# Patient Record
Sex: Male | Born: 1960 | Race: White | Hispanic: No | Marital: Married | Smoking: Never smoker
Health system: Southern US, Community
[De-identification: ages and names within clinical notes are randomized; demographics above are authoritative.]

## PROBLEM LIST (undated history)

## (undated) DIAGNOSIS — R519 Headache, unspecified: Secondary | ICD-10-CM

## (undated) DIAGNOSIS — F419 Anxiety disorder, unspecified: Secondary | ICD-10-CM

## (undated) DIAGNOSIS — R945 Abnormal results of liver function studies: Secondary | ICD-10-CM

## (undated) DIAGNOSIS — R7989 Other specified abnormal findings of blood chemistry: Secondary | ICD-10-CM

## (undated) DIAGNOSIS — R51 Headache: Secondary | ICD-10-CM

## (undated) DIAGNOSIS — E785 Hyperlipidemia, unspecified: Secondary | ICD-10-CM

## (undated) HISTORY — PX: HERNIA REPAIR: SHX51

## (undated) HISTORY — PX: CHOLECYSTECTOMY: SHX55

## (undated) HISTORY — PX: NO PAST SURGERIES: SHX2092

## (undated) HISTORY — PX: NASAL SEPTUM SURGERY: SHX37

---

## 2014-07-03 ENCOUNTER — Ambulatory Visit: Payer: TRICARE For Life (TFL)

## 2014-07-03 ENCOUNTER — Ambulatory Visit
Admission: EM | Admit: 2014-07-03 | Discharge: 2014-07-03 | Disposition: A | Discharge status: Other Acute Inpatient Hospital | Payer: TRICARE For Life (TFL) | Attending: Family Medicine | Admitting: Family Medicine

## 2014-07-03 DIAGNOSIS — W278XXA Contact with other nonpowered hand tool, initial encounter: Secondary | ICD-10-CM | POA: Insufficient documentation

## 2014-07-03 DIAGNOSIS — S62609B Fracture of unspecified phalanx of unspecified finger, initial encounter for open fracture: Secondary | ICD-10-CM | POA: Diagnosis not present

## 2014-07-03 DIAGNOSIS — S61211A Laceration without foreign body of left index finger without damage to nail, initial encounter: Secondary | ICD-10-CM | POA: Diagnosis present

## 2014-07-03 DIAGNOSIS — S62661B Nondisplaced fracture of distal phalanx of left index finger, initial encounter for open fracture: Secondary | ICD-10-CM | POA: Insufficient documentation

## 2014-07-03 MED ORDER — TETANUS-DIPHTH-ACELL PERTUSSIS 5-2.5-18.5 LF-MCG/0.5 IM SUSP
0.5000 mL | Freq: Once | INTRAMUSCULAR | Status: AC
  Administered 2014-07-03: 0.5 mL via INTRAMUSCULAR

## 2014-07-03 NOTE — ED Notes (Signed)
Patient states that he was at home and hammered his left hand index finger. He states that tetanus shot was in 2010. He has a laceration and bleeding.

## 2014-07-03 NOTE — ED Provider Notes (Addendum)
CSN: [id number]     Arrival date & time 07/03/14  1036 History   First MD Initiated Contact with Patient 07/03/14 1115     Chief Complaint  Patient presents with  . Finger Injury    Happened at 1000am today   (Consider location/radiation/quality/duration/timing/severity/associated sxs/prior Treatment) The history is provided by the patient and the spouse.   this a 54 year old ambidextrous male who presents with an injury to his left index fingertip. This happened earlier this morning when he was setting fencepost and inadvertently struck his finger tip with a hammer. There is a laceration just proximal to the nailbed and extends around to the radial side. He is not current on his  tetanus toxoid.  Past Surgical History  Procedure Laterality Date  . No past surgeries     No family history on file. History  Substance Use Topics  . Smoking status: Never Smoker   . Smokeless tobacco: Not on file  . Alcohol Use: 0.0 oz/week    0 Standard drinks or equivalent per week     Comment: 1 case of beer weekly    Review of Systems  All other systems reviewed and are negative.   Allergies  Review of patient's allergies indicates no known allergies.  Home Medications   Prior to Admission medications   Not on File   BP 132/87 mmHg  Pulse 54  Temp(Src) 97.4 F (36.3 C) (Tympanic)  Resp 16  Ht 6' (1.829 m)  Wt 215 lb (97.523 kg)  BMI 29.15 kg/m2  SpO2 99% Physical Exam  Constitutional: He is oriented to person, place, and time. He appears well-developed and well-nourished.  HENT:  Head: Normocephalic and atraumatic.  Eyes: EOM are normal. Pupils are equal, round, and reactive to light.  Neck: Normal range of motion. Neck supple.  Musculoskeletal: Normal range of motion. He exhibits edema and tenderness.  Examination of the left index finger shows a transverse laceration just proximal to the nailbed extending around the radial dorsal surface. There is no subungual hematoma present.  FDS and FDP is intact to stressing. The laceration does not have much separation except on the radial most border. Sensation the tip is decreased to light touch. There is no obvious deformity of the tip.  Neurological: He is alert and oriented to person, place, and time. He has normal reflexes.  Skin: Skin is warm and dry.  Psychiatric: He has a normal mood and affect. His behavior is normal. Judgment normal.    ED Course  Procedures (including critical care time) Labs Review Labs Reviewed - No data to display  Imaging Review Dg Finger Index Left  07/03/2014   CLINICAL DATA:  Hit with hammer  EXAM: LEFT INDEX FINGER 2+V  COMPARISON:  None.  FINDINGS: Transverse fracture of the tuft of the distal first phalanx. 1 mm density in the soft tissues may represent a bone fragment or a small foreign body. It has similar density to bone. There is soft tissue injury and laceration.  IMPRESSION: Transverse fracture of the tuft of the distal first phalanx without significant displacement. Probable small bone fragment in the soft tissues.   Electronically Signed   By: Marlan Palauharles  Clark M.D.   On: 07/03/2014 12:20     MDM   1. Fracture, finger, open, initial encounter    I had a long discussion with the patient and his wife. They tell me that  They are planning a trip to [place] beginning on Saturday, 2 days away. Because of his  open fracture I recommended that he not leave without being evaluated by orthopedic because a high incidence of infections with open fractures. I attempted to call 2 separate physicians and after 2 hours did not call me back to discuss the case with him leaving in 2 days and being gone for 1 week. Because of this,[facility] be [facility]. A dry sterile dressing was applied to the finger and the left [facility] by private vehicle.   Lutricia FeilWilliam P Avelynn Sellin, PA-C 07/03/14 1404  11:26 Medication Given MH  Tdap (BOOSTRIX) injection 0.5 mL -  Dose: 0.5 mL ; Route: Intramuscular ; Site: Right Deltoid ; Scheduled Time: [address]       Lutricia FeilWilliam P Kaidon Kinker, PA-C 07/23/14 1503

## 2014-07-03 NOTE — Discharge Instructions (Signed)

## 2015-08-22 ENCOUNTER — Encounter: Payer: Self-pay | Admitting: Gynecology

## 2015-08-22 ENCOUNTER — Ambulatory Visit (INDEPENDENT_AMBULATORY_CARE_PROVIDER_SITE_OTHER): Payer: TRICARE For Life (TFL)

## 2015-08-22 ENCOUNTER — Ambulatory Visit
Admission: EM | Admit: 2015-08-22 | Discharge: 2015-08-22 | Disposition: A | Discharge status: Home / Self Care | Payer: TRICARE For Life (TFL) | Attending: Family Medicine | Admitting: Family Medicine

## 2015-08-22 DIAGNOSIS — W540XXA Bitten by dog, initial encounter: Secondary | ICD-10-CM

## 2015-08-22 DIAGNOSIS — S61551A Open bite of right wrist, initial encounter: Secondary | ICD-10-CM

## 2015-08-22 HISTORY — DX: Headache, unspecified: R51.9

## 2015-08-22 HISTORY — DX: Hyperlipidemia, unspecified: E78.5

## 2015-08-22 HISTORY — DX: Headache: R51

## 2015-08-22 HISTORY — DX: Hemochromatosis, unspecified: E83.119

## 2015-08-22 HISTORY — DX: Other specified abnormal findings of blood chemistry: R79.89

## 2015-08-22 HISTORY — DX: Anxiety disorder, unspecified: F41.9

## 2015-08-22 HISTORY — DX: Abnormal results of liver function studies: R94.5

## 2015-08-22 MED ORDER — OXYCODONE-ACETAMINOPHEN 5-325 MG PO TABS: 1.0000 | ORAL_TABLET | Freq: Three times a day (TID) | ORAL | Status: DC | PRN

## 2015-08-22 MED ORDER — CEFTRIAXONE SODIUM 1 G IJ SOLR
1.0000 g | Freq: Once | INTRAMUSCULAR | Status: AC
  Administered 2015-08-22: 1 g via INTRAMUSCULAR

## 2015-08-22 MED ORDER — MUPIROCIN 2 % EX OINT: TOPICAL_OINTMENT | CUTANEOUS | Status: AC

## 2015-08-22 MED ORDER — AMOXICILLIN-POT CLAVULANATE 875-125 MG PO TABS: 1.0000 | ORAL_TABLET | Freq: Two times a day (BID) | ORAL | Status: AC

## 2015-08-22 NOTE — ED Notes (Signed)
Patient c/o dog bite at right wrist x yesterday around noon. Per patient was bitten by the neighbor dog. Patient stated that the dog has been vaccinated per neighbor. Patient right wrist swollen / painful.

## 2015-08-22 NOTE — ED Provider Notes (Signed)
[facility]  ____________________________________________  Time seen: Approximately 0845 AM  I have reviewed the triage vital signs and the nursing notes.   HISTORY  Chief Complaint Animal Bite  HPI Benjamin Stout is a 55 y.o. male presents with complaint of right wrist dog bite. Patient reports that he sustained a dog bite to his right wrist yesterday afternoon. Patient states that he went over to his neighbor's house to help them work on something and states he reached over the fence to open the gate. Patient states when he reached over the fence, the dog jumped up and bit his right forearm and tried to pull some, but then released. Patient reports it was 1 bite. 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 Denies any other bite or injury. Denies fall to the ground, head injury or loss consciousness. Patient reports he is right-hand dominant.  Patient reports he and his family cleaned and irrigated the wounds immediately after, but states he is here today as his wife is concerned that it may get infected. Denies any drainage. Patient states mild swelling and mild redness. States mild pain with wrist rotation. Denies any other fall or trauma. Denies history of right wrist issues. Reports tetanus immunization is up-to-date, reports received 1 year ago.  Reports this is a neighbor's dog and per patient neighbors report that dog is up-to-date on rabies immunizations.   Past Medical History  Diagnosis Date  . Anxiety   . Hemochromatosis   . Head ache   . Dyslipidemia   . Elevated LFTs     There are no active problems to display for this patient.   Past Surgical History  Procedure Laterality Date  . No past surgeries    . Cholecystectomy    . Hernia repair    . Nasal septum surgery      Current Outpatient Rx  Name  Route  Sig  Dispense  Refill  . escitalopram (LEXAPRO) 10 MG tablet   Oral   Take 10 mg by mouth daily.         . Multiple Vitamin (MULTIVITAMIN)  capsule   Oral   Take 1 capsule by mouth daily.         .           .           .             Allergies Review of patient's allergies indicates no known allergies.  No family history on file.  Social History Social History  Substance Use Topics  . Smoking status: Never Smoker   . Smokeless tobacco: None  . Alcohol Use: 0.0 oz/week    0 Standard drinks or equivalent per week     Comment: 1 case of beer weekly    Review of Systems Constitutional: No fever/chills Eyes: No visual changes. ENT: No sore throat. Cardiovascular: Denies chest pain. Respiratory: Denies shortness of breath. Gastrointestinal: No abdominal pain.  No nausea, no vomiting.  No diarrhea.  No constipation. Genitourinary: Negative for dysuria. Musculoskeletal: Negative for back pain. Skin: Negative for rash.As above. Neurological: Negative for headaches, focal weakness or numbness.  10-point ROS otherwise negative.  ____________________________________________   PHYSICAL EXAM:  VITAL SIGNS: ED Triage Vitals  Enc Vitals Group     BP 08/22/15 0824 115/85 mmHg     Pulse Rate 08/22/15 0824 60     Resp 08/22/15 0824 16     Temp 08/22/15 0824 98.2 F (36.8 C)  Temp Source 08/22/15 0824 Oral     SpO2 08/22/15 0824 100 %     Weight 08/22/15 0824 210 lb (95.255 kg)     Height 08/22/15 0824 6' (1.829 m)     Head Cir --      Peak Flow --      Pain Score 08/22/15 0829 4     Pain Loc --      Pain Edu? --      Excl. in GC? --     Constitutional: Alert and oriented. Well appearing and in no acute distress. Eyes: Conjunctivae are normal. PERRL. EOMI. Head: Atraumatic.  Ears: Normal external appearance bilaterally.  Nose: No congestion/rhinnorhea.  Mouth/Throat: Mucous membranes are moist.   Neck: No stridor.  No cervical spine tenderness to palpation. Cardiovascular: Normal rate, regular rhythm. Grossly normal heart sounds.  Good peripheral circulation. Respiratory: Normal respiratory effort.   No retractions. Lungs CTAB. No wheezes, rales or rhonchi. Musculoskeletal: No lower or upper extremity tenderness nor edema.  No cervical, thoracic or lumbar tenderness to palpation. Except: Right distal wrist dorsal lateral aspect mild distal ulnar tenderness and mild distal wrist tenderness, mid distal wrist dorsally minimal to mild swelling and mild erythema, healing puncture wounds 3 located to distal dorsal mid wrist and 3 to distal volar aspect of mid wrist, mild pain with right wrist rotation but full range of motion present, bilateral hand grips strong and equal, bilateral distal radial pulses strong and equal, right upper extremity and right hand with full range of motion and without motor or tendon deficits, sensation intact. Neurologic:  Normal speech and language. No gross focal neurologic deficits are appreciated. No gait instability. Skin:  Skin is warm, dry and intact. No rash noted. Psychiatric: Mood and affect are normal. Speech and behavior are normal.  ____________________________________________   LABS (all labs ordered are listed, but only abnormal results are displayed)  Labs Reviewed - No data to display  RADIOLOGY  Dg Wrist Complete Right  08/22/2015  CLINICAL DATA:  Dog bite yesterday with swelling and pain, initial encounter EXAM: RIGHT WRIST - COMPLETE 3+ VIEW COMPARISON:  None. FINDINGS: No acute fracture or dislocation is noted. Mild soft tissue swelling is noted consistent with the recent injury. IMPRESSION: Soft tissue swelling without acute bony abnormality. Electronically Signed   By: Alcide CleverMark  Lukens M.D.   On: 08/22/2015 09:27   ____________________________________________   PROCEDURES  Procedure(s) performed:  _Dressing applied by RN and cock up Velcro wrist splint applied by RN. Neurovascular intact post application. ______________________   INITIAL IMPRESSION / ASSESSMENT AND PLAN / ED COURSE  Pertinent labs & imaging results that were available  during my care of the patient were reviewed by me and considered in my medical decision making (see chart for details).  Well-appearing patient. No acute distress. Presents for the complaints of right wrist injury post dog bite yesterday afternoon. Full range of motion. Neurovascular intact to right hand and right upper extremity. Healing wounds. Mild dorsal erythema around the wounds swelling with mild swelling. Will evaluate x-ray. Patient reports tetanus immunization is up-to-date. [facility].  Right wrist x-ray soft tissue swelling without acute bony abnormality per radiologist. 1 g Rocephin IM given once in urgent care. Will treat with oral Augmentin and topical Bactroban. Encouraged cleaning, ice, elevation and rest splint given. When necessary Percocet as needed for breakthrough pain. Encouraged 2-3 day wound check.Discussed indication, risks and benefits of medications with patient.  Discussed follow up with Primary  care physician this week. Discussed follow up and return parameters including no resolution or any worsening concerns. Patient verbalized understanding and agreed to plan.   ____________________________________________   FINAL CLINICAL IMPRESSION(S) / ED DIAGNOSES  Final diagnoses:  Dog bite of right wrist, initial encounter     Discharge Medication List as of 08/22/2015  9:19 AM    START taking these medications   Details  amoxicillin-clavulanate (AUGMENTIN) 875-125 MG tablet Take 1 tablet by mouth every 12 (twelve) hours., Starting 08/22/2015, Until Discontinued, Normal    mupirocin ointment (BACTROBAN) 2 % Apply three times a day for 5 days., Normal    oxyCODONE-acetaminophen (ROXICET) 5-325 MG tablet Take 1 tablet by mouth every 8 (eight) hours as needed for moderate pain or severe pain (Do not drive or operate heavy machinery while taking as can cause drowsiness.)., Starting 08/22/2015, Until Discontinued, Print        Note: This  dictation was prepared with Dragon dictation along with smaller phrase technology. Any transcriptional errors that result from this process are unintentional.       Renford DillsLindsey Grayden Burley, NP 08/22/15 1258

## 2015-08-22 NOTE — Discharge Instructions (Signed)
Take medication as prescribed. Apply ice and elevate. Clean daily with soap and water.    Wound check in 2 days.   Stay in contact with animal control.   Follow up with your primary care physician this week. Return to Urgent care for new or worsening concerns.    Animal Bite Animal bites can range from mild to serious. An animal bite can result in a scratch on the skin, a deep open cut, a puncture of the skin, a crush injury, or tearing away of the skin or a body part. A small bite from a house pet will usually not cause serious problems. However, some animal bites can become infected or injure a bone or other tissue.  Bites from certain animals can be more dangerous because of the risk of spreading rabies, which is a serious viral infection. This risk is higher with bites from stray animals or wild animals, such as raccoons, foxes, skunks, and bats. Dogs are responsible for most animal bites. Children are bitten more often than adults. SYMPTOMS  Common symptoms of an animal bite include:   Pain.   Bleeding.   Swelling.   Bruising.  DIAGNOSIS  This condition may be diagnosed based on a physical exam and medical history. Your health care provider will examine the wound and ask for details about the animal and how the bite happened. You may also have tests, such as:   Blood tests to check for infection or to determine if surgery is needed.  X-rays to check for damage to bones or joints.  Culture test. This uses a sample of fluid from the wound to check for infection. TREATMENT  Treatment varies depending on the location and type of animal bite and your medical history. Treatment may include:   Wound care. This often includes cleaning the wound, flushing the wound with saline solution, and applying a bandage (dressing). Sometimes, the wound is left open to heal because of the high risk of infection. However, in some cases, the wound may be closed with stitches (sutures), staples,  skin glue, or adhesive strips.   Antibiotic medicine.   Tetanus shot.   Rabies treatment if the animal could have rabies.  In some cases, bites that have become infected may require IV antibiotics and surgical treatment in the hospital.  HOME CARE INSTRUCTIONS Wound Care  Follow instructions from your health care provider about how to take care of your wound. Make sure you:  Wash your hands with soap and water before you change your dressing. If soap and water are not available, use hand sanitizer.  Change your dressing as told by your health care provider.  Leave sutures, skin glue, or adhesive strips in place. These skin closures may need to be in place for 2 weeks or longer. If adhesive strip edges start to loosen and curl up, you may trim the loose edges. Do not remove adhesive strips completely unless your health care provider tells you to do that.  Check your wound every day for signs of infection. Watch for:   Increasing redness, swelling, or pain.   Fluid, blood, or pus.  General Instructions  Take or apply over-the-counter and prescription medicines only as told by your health care provider.   If you were prescribed an antibiotic, take or apply it as told by your health care provider. Do not stop using the antibiotic even if your condition improves.   Keep the injured area raised (elevated) above the level of your heart while you  are sitting or lying down, if this is possible.   If directed, apply ice to the injured area.   Put ice in a plastic bag.   Place a towel between your skin and the bag.   Leave the ice on for 20 minutes, 2-3 times per day.   Keep all follow-up visits as told by your health care provider. This is important.  SEEK MEDICAL CARE IF:  You have increasing redness, swelling, or pain at the site of your wound.   You have a general feeling of sickness (malaise).   You feel nauseous or you vomit.   You have pain that  does not get better.  SEEK IMMEDIATE MEDICAL CARE IF:  You have a red streak extending away from your wound.   You have fluid, blood, or pus coming from your wound.   You have a fever or chills.   You have trouble moving your injured area.   You have numbness or tingling extending beyond the wound.   This information is not intended to replace advice given to you by your health care provider. Make sure you discuss any questions you have with your health care provider.   Document Released: 10/26/2010 Document Revised: 10/29/2014 Document Reviewed: 06/25/2014 Elsevier Interactive Patient Education 2016 [employer]  Wound Care Taking care of your wound properly can help to prevent pain and infection. It can also help your wound to heal more quickly.  HOW TO CARE FOR YOUR WOUND  Take or apply over-the-counter and prescription medicines only as told by your health care provider.  If you were prescribed antibiotic medicine, take or apply it as told by your health care provider. Do not stop using the antibiotic even if your condition improves.  Clean the wound each day or as told by your health care provider.  Wash the wound with mild soap and water.  Rinse the wound with water to remove all soap.  Pat the wound dry with a clean towel. Do not rub it.  There are many different ways to close and cover a wound. For example, a wound can be covered with stitches (sutures), skin glue, or adhesive strips. Follow instructions from your health care provider about:  How to take care of your wound.  When and how you should change your bandage (dressing).  When you should remove your dressing.  Removing whatever was used to close your wound.  Check your wound every day for signs of infection. Watch for:  Redness, swelling, or pain.  Fluid, blood, or pus.  Keep the dressing dry until your health care provider says it can be removed. Do not take baths, swim, use a hot tub, or do  anything that would put your wound underwater until your health care provider approves.  Raise (elevate) the injured area above the level of your heart while you are sitting or lying down.  Do not scratch or pick at the wound.  Keep all follow-up visits as told by your health care provider. This is important. SEEK MEDICAL CARE IF:  You received a tetanus shot and you have swelling, severe pain, redness, or bleeding at the injection site.  You have a fever.  Your pain is not controlled with medicine.  You have increased redness, swelling, or pain at the site of your wound.  You have fluid, blood, or pus coming from your wound.  You notice a bad smell coming from your wound or your dressing. SEEK IMMEDIATE MEDICAL CARE IF:  You  have a red streak going away from your wound.   This information is not intended to replace advice given to you by your health care provider. Make sure you discuss any questions you have with your health care provider.   Document Released: 11/17/2007 Document Revised: 06/24/2014 Document Reviewed: 02/03/2014 Elsevier Interactive Patient Education [employer]

## 2017-03-22 ENCOUNTER — Emergency Department

## 2017-03-22 ENCOUNTER — Emergency Department
Admission: EM | Admit: 2017-03-22 | Discharge: 2017-03-22 | Disposition: A | Discharge status: Home / Self Care | Attending: Emergency Medicine | Admitting: Emergency Medicine

## 2017-03-22 ENCOUNTER — Other Ambulatory Visit: Payer: Self-pay

## 2017-03-22 ENCOUNTER — Encounter: Payer: Self-pay | Admitting: *Deleted

## 2017-03-22 DIAGNOSIS — Y929 Unspecified place or not applicable: Secondary | ICD-10-CM | POA: Insufficient documentation

## 2017-03-22 DIAGNOSIS — W5512XA Struck by horse, initial encounter: Secondary | ICD-10-CM | POA: Insufficient documentation

## 2017-03-22 DIAGNOSIS — Y9389 Activity, other specified: Secondary | ICD-10-CM | POA: Diagnosis not present

## 2017-03-22 DIAGNOSIS — Z79899 Other long term (current) drug therapy: Secondary | ICD-10-CM | POA: Diagnosis not present

## 2017-03-22 DIAGNOSIS — Y998 Other external cause status: Secondary | ICD-10-CM | POA: Insufficient documentation

## 2017-03-22 DIAGNOSIS — S0990XA Unspecified injury of head, initial encounter: Secondary | ICD-10-CM | POA: Diagnosis not present

## 2017-03-22 DIAGNOSIS — S0993XA Unspecified injury of face, initial encounter: Secondary | ICD-10-CM

## 2017-03-22 MED ORDER — LIDOCAINE HCL 1 % IJ SOLN
5.0000 mL | Freq: Once | INTRAMUSCULAR | Status: DC
  Filled 2017-03-22: qty 5

## 2017-03-22 MED ORDER — LIDOCAINE HCL 1 % IJ SOLN
5.0000 mL | Freq: Once | INTRAMUSCULAR | Status: DC
Start: 2017-03-22 — End: 2017-03-23
  Filled 2017-03-22: qty 5

## 2017-03-22 MED ORDER — LIDOCAINE HCL (PF) 1 % IJ SOLN
INTRAMUSCULAR | Status: DC
Start: 2017-03-22 — End: 2017-03-23
  Filled 2017-03-22: qty 15

## 2017-03-22 MED ORDER — OXYCODONE-ACETAMINOPHEN 5-325 MG PO TABS
1.0000 | ORAL_TABLET | ORAL | Status: DC | PRN
  Administered 2017-03-22: 1 via ORAL
  Filled 2017-03-22: qty 1

## 2017-03-22 MED ORDER — OXYCODONE-ACETAMINOPHEN 5-325 MG PO TABS
1.0000 | ORAL_TABLET | Freq: Four times a day (QID) | ORAL | 0 refills | Status: AC | PRN
Start: 2017-03-22 — End: 2017-03-27

## 2017-03-22 MED ORDER — SULFAMETHOXAZOLE-TRIMETHOPRIM 800-160 MG PO TABS: 1.0000 | ORAL_TABLET | Freq: Two times a day (BID) | ORAL | 0 refills | Status: AC

## 2017-03-22 NOTE — ED Notes (Signed)
Patient transported to CT 

## 2017-03-22 NOTE — ED Triage Notes (Signed)
Pt to ED reporting he was kicked in the head by his horse. Pt denies having lost consciousness. No changes in vision reported. Swelling noted around left eye and two inch long with bleeding controlled. 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 RN dose not see stitch in place at this time. No neuro deficits noted. Pt denies pain or injury other than head.

## 2017-03-22 NOTE — ED Provider Notes (Signed)
[facility] Emergency Department Provider Note  ____________________________________________  Time seen: Approximately 10:05 PM  I have reviewed the triage vital signs and the nursing notes.   HISTORY  Chief Complaint Facial Trauma    HPI Benjamin Stout is a 57 y.o. male presenting to the emergency department after patient was kicked in the face by a horse.  Patient denies loss of consciousness.  He denies new blurry vision.  Patient sustained lacerations to the forehead, left cheek and left inferior eyelid.  Patient recently had a basal cell carcinoma removed by Dr. Bing Plume.  Patient denies nausea, vomiting abdominal pain.  He denies neck pain, weakness, radiculopathy or changes in sensation in the upper or lower extremities.  Patient has been speaking in complete sentences.  No alleviating measures have been attempted.  Past Medical History:  Diagnosis Date  . Anxiety   . Dyslipidemia   . Elevated LFTs   . Head ache   . Hemochromatosis     There are no active problems to display for this patient.   Past Surgical History:  Procedure Laterality Date  . CHOLECYSTECTOMY    . HERNIA REPAIR    . NASAL SEPTUM SURGERY    . NO PAST SURGERIES      Prior to Admission medications   Medication Sig Start Date End Date Taking? Authorizing Provider  amoxicillin-clavulanate (AUGMENTIN) 875-125 MG tablet Take 1 tablet by mouth every 12 (twelve) hours. 08/22/15   Renford Dills, NP  escitalopram (LEXAPRO) 10 MG tablet Take 10 mg by mouth daily.    [provider]  Multiple Vitamin (MULTIVITAMIN) capsule Take 1 capsule by mouth daily.    [provider]  mupirocin ointment (BACTROBAN) 2 % Apply three times a day for 5 days. 08/22/15   Renford Dills, NP  oxyCODONE-acetaminophen (PERCOCET/ROXICET) 5-325 MG tablet Take 1 tablet by mouth every 6 (six) hours as needed for up to 5 days for severe pain. 03/22/17 03/27/17  Orvil Feil, PA-C   sulfamethoxazole-trimethoprim (BACTRIM DS,SEPTRA DS) 800-160 MG tablet Take 1 tablet by mouth 2 (two) times daily for 7 days. 03/22/17 03/29/17  Orvil Feil, PA-C    Allergies Patient has no known allergies.  History reviewed. No pertinent family history.  Social History Social History   Tobacco Use  . Smoking status: Never Smoker  . Smokeless tobacco: Never Used  Substance Use Topics  . Alcohol use: Yes    Alcohol/week: 0.0 oz    Comment: 1 case of beer weekly  . Drug use: No     Review of Systems  Eyes: No visual changes. No discharge ENT: No upper respiratory complaints. Cardiovascular: no chest pain. Respiratory: no cough. No SOB. Gastrointestinal: No abdominal pain.  No nausea, no vomiting.  No diarrhea.  No constipation. Musculoskeletal: Negative for musculoskeletal pain. Skin: Patient has facial lacerations.  Neurological: Patient has headache, no focal weakness or numbness.   ____________________________________________   PHYSICAL EXAM:  VITAL SIGNS: ED Triage Vitals  Enc Vitals Group     BP 03/22/17 1803 (!) 148/101     Pulse Rate 03/22/17 1803 78     Resp 03/22/17 1803 20     Temp 03/22/17 1803 98.6 F (37 C)     Temp Source 03/22/17 1803 Oral     SpO2 03/22/17 1803 97 %     Weight 03/22/17 1800 210 lb (95.3 kg)     Height 03/22/17 1800 6\' 1"  (1.854 m)     Head Circumference --  Peak Flow --      Pain Score 03/22/17 1800 5     Pain Loc --      Pain Edu? --      Excl. in GC? --      Constitutional: Alert and oriented. Well appearing and in no acute distress. Eyes: Conjunctivae are normal. PERRL. EOMI. Head: Atraumatic. ENT:      Ears: TMs are pearly bilaterally.      Nose: No congestion/rhinnorhea.      Mouth/Throat: Mucous membranes are moist.  Neck: No stridor.  No cervical spine tenderness to palpation.  Cardiovascular: Normal rate, regular rhythm. Normal S1 and S2.  Good peripheral circulation. Respiratory: Normal respiratory  effort without tachypnea or retractions. Lungs CTAB. Good air entry to the bases with no decreased or absent breath sounds. Musculoskeletal: Full range of motion to all extremities. No gross deformities appreciated. Neurologic:  Normal speech and language. No gross focal neurologic deficits are appreciated.  Skin: Patient has a 1-1/2 cm laceration at the lateral aspect of the left inferior eyelid with muscle exposure.  Patient has a 8 cm laceration on cheek with muscle exposure.  Patient has a 4 cm laceration along the left forehead. Psychiatric: Mood and affect are normal. Speech and behavior are normal. Patient exhibits appropriate insight and judgement.   ____________________________________________   LABS (all labs ordered are listed, but only abnormal results are displayed)  Labs Reviewed - No data to display ____________________________________________  EKG   ____________________________________________  RADIOLOGY Geraldo Pitter, personally viewed and evaluated these images (plain radiographs) as part of my medical decision making, as well as reviewing the written report by the radiologist.  Ct Head Wo Contrast  Result Date: 03/22/2017 CLINICAL DATA:  Kicked in head by horse with headaches and facial swelling EXAM: CT HEAD WITHOUT CONTRAST CT MAXILLOFACIAL WITHOUT CONTRAST CT CERVICAL SPINE WITHOUT CONTRAST TECHNIQUE: Multidetector CT imaging of the head, cervical spine, and maxillofacial structures were performed using the standard protocol without intravenous contrast. Multiplanar CT image reconstructions of the cervical spine and maxillofacial structures were also generated. COMPARISON:  None. FINDINGS: CT HEAD FINDINGS Brain: No evidence of acute infarction, hemorrhage, hydrocephalus, extra-axial collection or mass lesion/mass effect. Vascular: No hyperdense vessel or unexpected calcification. Skull: Normal. Negative for fracture or focal lesion. Other: None. CT MAXILLOFACIAL  FINDINGS Osseous: No fracture or mandibular dislocation. No destructive process. Orbits: The orbits and their contents are within normal limits. Some periorbital swelling is noted on the left consistent with the recent injury. Small amount of air is noted within the soft tissues anterior to the left orbit consistent with the recent surgical history. Sinuses: Paranasal sinuses are within normal limits. Soft tissues: Soft tissue swelling is noted about the left orbit as described above. No other focal abnormality is seen. CT CERVICAL SPINE FINDINGS Alignment: Within normal limits. Skull base and vertebrae: 7 cervical segments are well visualized. Vertebral body height is well maintained. Osteophytic changes are noted from C3-C7. No significant anterolisthesis is noted. Mild facet hypertrophic changes are noted. No acute fracture or acute facet abnormality is noted. Posterior fusion defect at C1 is noted. Soft tissues and spinal canal: No acute soft tissue abnormality is noted Upper chest: Negative. Visualized upper chest is within normal limits. Other: None IMPRESSION: CT of the head: No acute intracranial abnormality is noted. CT of the maxillofacial bones: Soft tissue swelling about the left orbit is noted consistent with the recent injury and prior surgery. No acute bony abnormality is noted. CT  of cervical spine: Multilevel degenerative changes without acute abnormality. Electronically Signed   By: Alcide Clever M.D.   On: 03/22/2017 19:04   Ct Cervical Spine Wo Contrast  Result Date: 03/22/2017 CLINICAL DATA:  Kicked in head by horse with headaches and facial swelling EXAM: CT HEAD WITHOUT CONTRAST CT MAXILLOFACIAL WITHOUT CONTRAST CT CERVICAL SPINE WITHOUT CONTRAST TECHNIQUE: Multidetector CT imaging of the head, cervical spine, and maxillofacial structures were performed using the standard protocol without intravenous contrast. Multiplanar CT image reconstructions of the cervical spine and maxillofacial  structures were also generated. COMPARISON:  None. FINDINGS: CT HEAD FINDINGS Brain: No evidence of acute infarction, hemorrhage, hydrocephalus, extra-axial collection or mass lesion/mass effect. Vascular: No hyperdense vessel or unexpected calcification. Skull: Normal. Negative for fracture or focal lesion. Other: None. CT MAXILLOFACIAL FINDINGS Osseous: No fracture or mandibular dislocation. No destructive process. Orbits: The orbits and their contents are within normal limits. Some periorbital swelling is noted on the left consistent with the recent injury. Small amount of air is noted within the soft tissues anterior to the left orbit consistent with the recent surgical history. Sinuses: Paranasal sinuses are within normal limits. Soft tissues: Soft tissue swelling is noted about the left orbit as described above. No other focal abnormality is seen. CT CERVICAL SPINE FINDINGS Alignment: Within normal limits. Skull base and vertebrae: 7 cervical segments are well visualized. Vertebral body height is well maintained. Osteophytic changes are noted from C3-C7. No significant anterolisthesis is noted. Mild facet hypertrophic changes are noted. No acute fracture or acute facet abnormality is noted. Posterior fusion defect at C1 is noted. Soft tissues and spinal canal: No acute soft tissue abnormality is noted Upper chest: Negative. Visualized upper chest is within normal limits. Other: None IMPRESSION: CT of the head: No acute intracranial abnormality is noted. CT of the maxillofacial bones: Soft tissue swelling about the left orbit is noted consistent with the recent injury and prior surgery. No acute bony abnormality is noted. CT of cervical spine: Multilevel degenerative changes without acute abnormality. Electronically Signed   By: Alcide Clever M.D.   On: 03/22/2017 19:04   Ct Maxillofacial Wo Contrast  Result Date: 03/22/2017 CLINICAL DATA:  Kicked in head by horse with headaches and facial swelling EXAM: CT  HEAD WITHOUT CONTRAST CT MAXILLOFACIAL WITHOUT CONTRAST CT CERVICAL SPINE WITHOUT CONTRAST TECHNIQUE: Multidetector CT imaging of the head, cervical spine, and maxillofacial structures were performed using the standard protocol without intravenous contrast. Multiplanar CT image reconstructions of the cervical spine and maxillofacial structures were also generated. COMPARISON:  None. FINDINGS: CT HEAD FINDINGS Brain: No evidence of acute infarction, hemorrhage, hydrocephalus, extra-axial collection or mass lesion/mass effect. Vascular: No hyperdense vessel or unexpected calcification. Skull: Normal. Negative for fracture or focal lesion. Other: None. CT MAXILLOFACIAL FINDINGS Osseous: No fracture or mandibular dislocation. No destructive process. Orbits: The orbits and their contents are within normal limits. Some periorbital swelling is noted on the left consistent with the recent injury. Small amount of air is noted within the soft tissues anterior to the left orbit consistent with the recent surgical history. Sinuses: Paranasal sinuses are within normal limits. Soft tissues: Soft tissue swelling is noted about the left orbit as described above. No other focal abnormality is seen. CT CERVICAL SPINE FINDINGS Alignment: Within normal limits. Skull base and vertebrae: 7 cervical segments are well visualized. Vertebral body height is well maintained. Osteophytic changes are noted from C3-C7. No significant anterolisthesis is noted. Mild facet hypertrophic changes are noted. No acute fracture  or acute facet abnormality is noted. Posterior fusion defect at C1 is noted. Soft tissues and spinal canal: No acute soft tissue abnormality is noted Upper chest: Negative. Visualized upper chest is within normal limits. Other: None IMPRESSION: CT of the head: No acute intracranial abnormality is noted. CT of the maxillofacial bones: Soft tissue swelling about the left orbit is noted consistent with the recent injury and prior  surgery. No acute bony abnormality is noted. CT of cervical spine: Multilevel degenerative changes without acute abnormality. Electronically Signed   By: Alcide Clever M.D.   On: 03/22/2017 19:04    ____________________________________________    PROCEDURES  Procedure(s) performed:    Procedures  LACERATION REPAIR Performed by: Orvil Feil Authorized by: Orvil Feil Consent: Verbal consent obtained. Risks and benefits: risks, benefits and alternatives were discussed Consent given by: patient Patient identity confirmed: provided demographic data Prepped and Draped in normal sterile fashion Wound explored  Laceration Location: Forehead  Laceration Length: 4 cm  No Foreign Bodies seen or palpated  Anesthesia: local infiltration  Local anesthetic: lidocaine 1% without epinephrine  Anesthetic total: 3 ml  Irrigation method: syringe Amount of cleaning: standard  Skin closure: 5-0 Ethilon   Number of sutures: 6  Technique: Simple Interrupted  Patient tolerance: Patient tolerated the procedure well with no immediate complications.  LACERATION REPAIR Performed by: Orvil Feil Authorized by: Orvil Feil Consent: Verbal consent obtained. Risks and benefits: risks, benefits and alternatives were discussed Consent given by: patient Patient identity confirmed: provided demographic data Prepped and Draped in normal sterile fashion Wound explored  Laceration Location: Left Cheek  Laceration Length: 8 cm  No Foreign Bodies seen or palpated  Anesthesia: local infiltration  Local anesthetic: lidocaine 1% without epinephrine  Anesthetic total: 5 ml  Irrigation method: syringe Amount of cleaning: standard  Skin closure: 5-0 Ethilon   Number of sutures:  Superficial: 6 Simple Interrupted Deep: 3 Subcutaneous Interrupted   Patient tolerance: Patient tolerated the procedure well with no immediate complications.  LACERATION REPAIR Performed by:  Orvil Feil Authorized by: Orvil Feil Consent: Verbal consent obtained. Risks and benefits: risks, benefits and alternatives were discussed Consent given by: patient Patient identity confirmed: provided demographic data Prepped and Draped in normal sterile fashion Wound explored  Laceration Location: Left inferior eyelid  Laceration Length: 1.5 cm  No Foreign Bodies seen or palpated  Anesthesia: local infiltration  Local anesthetic: lidocaine 1% without epinephrine  Anesthetic total: 2 ml  Irrigation method: syringe Amount of cleaning: standard  Skin closure: 6-0 Ethilon   Number of sutures:  Superficial: 4 Simple Interrupted  Patient tolerance: Patient tolerated the procedure well with no immediate complications.    Medications  oxyCODONE-acetaminophen (PERCOCET/ROXICET) 5-325 MG per tablet 1 tablet (1 tablet Oral Given 03/22/17 1808)  lidocaine (XYLOCAINE) 1 % (with pres) injection 5 mL (not administered)  lidocaine (XYLOCAINE) 1 % (with pres) injection 5 mL (not administered)  lidocaine (XYLOCAINE) 1 % (with pres) injection 5 mL (not administered)     ____________________________________________   INITIAL IMPRESSION / ASSESSMENT AND PLAN / ED COURSE  Pertinent labs & imaging results that were available during my care of the patient were reviewed by me and considered in my medical decision making (see chart for details).  Review of the [place] CSRS was performed in accordance of the [facility] prior to dispensing any controlled drugs.     Assessment and Plan: Facial contusion Facial lacerations Patient presents to the emergency department after being  kicked by a horse.  Differential diagnosis included blowout fracture, cervical spine fracture, subdural hematoma, laceration and globe trauma.  CT head, CT maxillofacial and CT cervical spine were reassuring.  Patient underwent laceration repair in the emergency department.  Strict follow-up precautions were given to  make an appointment to see Dr. Chestine Sporelark to address inferior left eyelid laceration repair given recent surgery.  Other sutures were advised to be removed in 5 days.  Patient was discharged with Bactrim and Roxicet.  Vital signs are reassuring prior to discharge.  All patient questions were answered.    ____________________________________________  FINAL CLINICAL IMPRESSION(S) / ED DIAGNOSES  Final diagnoses:  Facial injury, initial encounter      NEW MEDICATIONS STARTED DURING THIS VISIT:  ED Discharge Orders        Ordered    oxyCODONE-acetaminophen (PERCOCET/ROXICET) 5-325 MG tablet  Every 6 hours PRN     03/22/17 2157    sulfamethoxazole-trimethoprim (BACTRIM DS,SEPTRA DS) 800-160 MG tablet  2 times daily     03/22/17 2157          This chart was dictated using voice recognition software/Dragon. Despite best efforts to proofread, errors can occur which can change the meaning. Any change was purely unintentional.    Orvil FeilWoods, Jettie Mannor M, PA-C 03/22/17 2221    Myrna BlazerSchaevitz, David Matthew, MD 03/24/17 [phone]

## 2018-08-13 IMAGING — CT CT MAXILLOFACIAL W/O CM
3 of 12 series · 9 of 47 positions shown, 11 images · non-contrast
Comparison: None.

CLINICAL DATA: Kicked in head by horse with headaches and facial
swelling

EXAM:
CT HEAD WITHOUT CONTRAST
CT MAXILLOFACIAL WITHOUT CONTRAST
CT CERVICAL SPINE WITHOUT CONTRAST
TECHNIQUE: Multidetector CT imaging of the head, cervical spine, and
maxillofacial structures were performed using the standard protocol
without intravenous contrast. Multiplanar CT image reconstructions
of the cervical spine and maxillofacial structures were also
generated.

[Series 8: sagittal bone · sagittal · 0.33mm/px · 1 of 238 slices shown]
[im 119/238  bone]
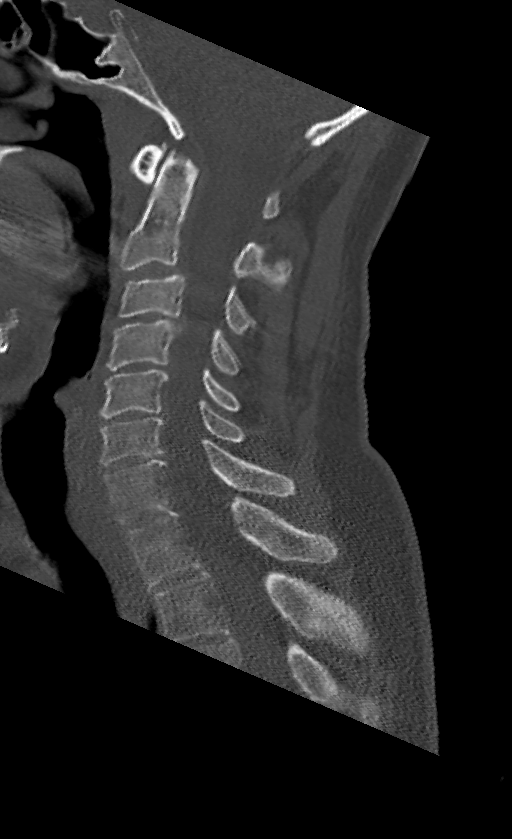

[Series 10: orthogonal axials · axial · 0.31mm/px · z∈[-285,-116]mm · 7 of 115 slices shown, 9 images]
[im 12/115  brain]
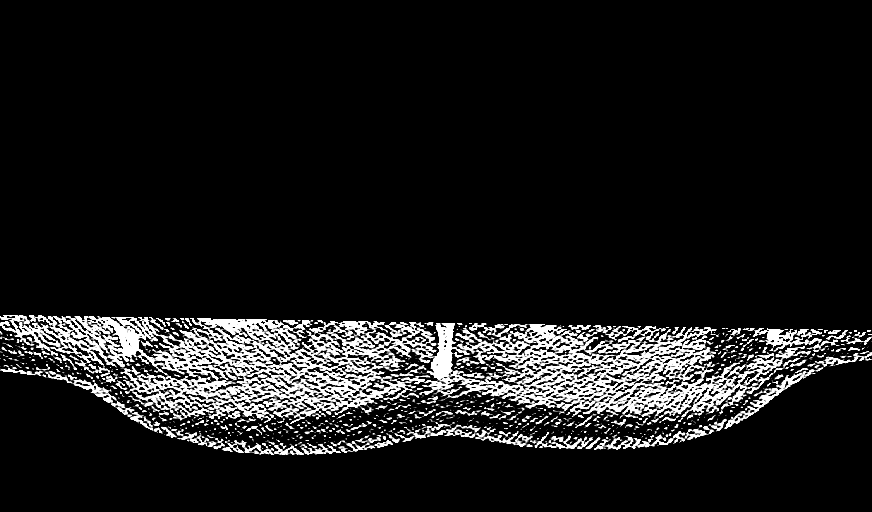
[im 12/115  bone]
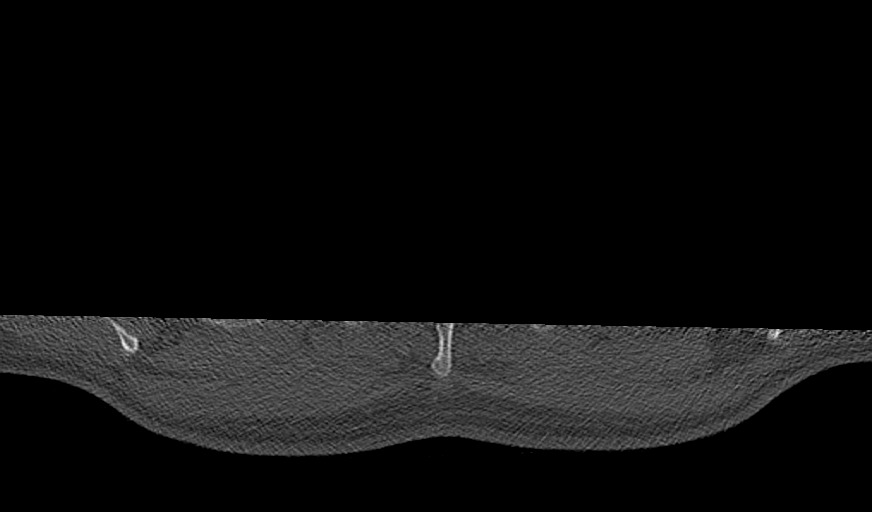
[im 23/115  bone]
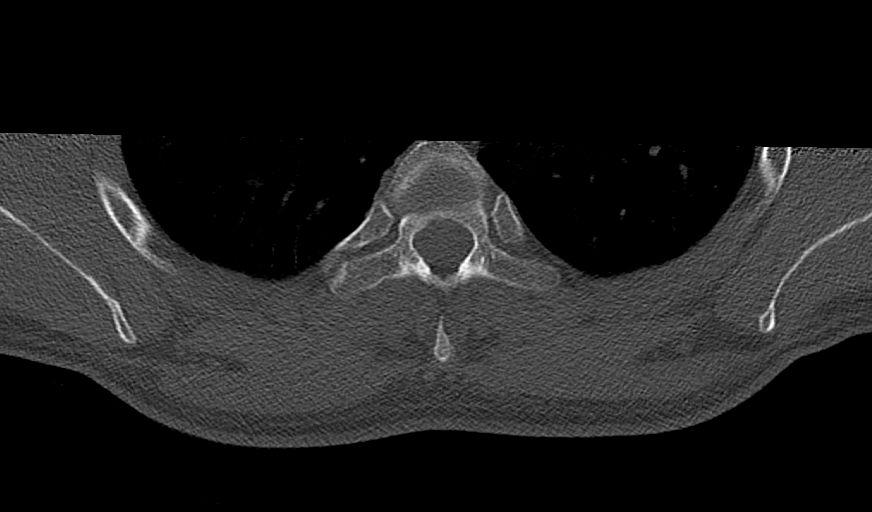
[im 46/115  bone]
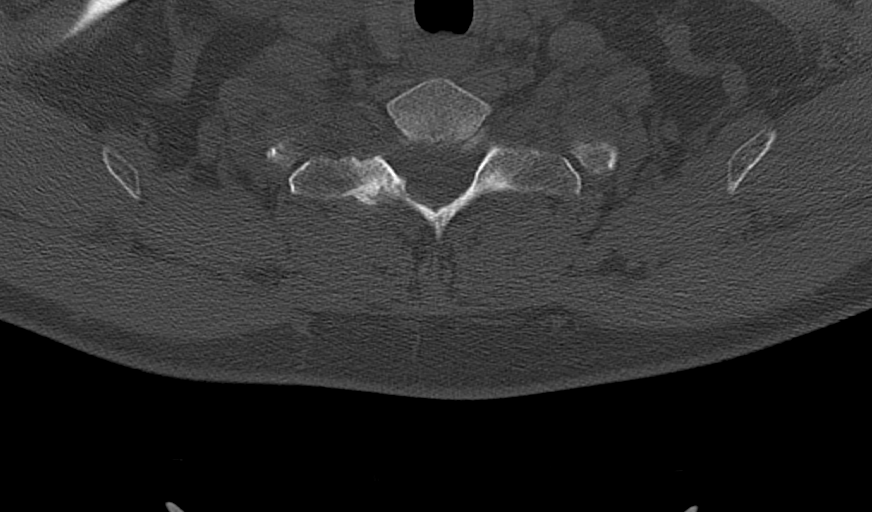
[im 58/115  bone]
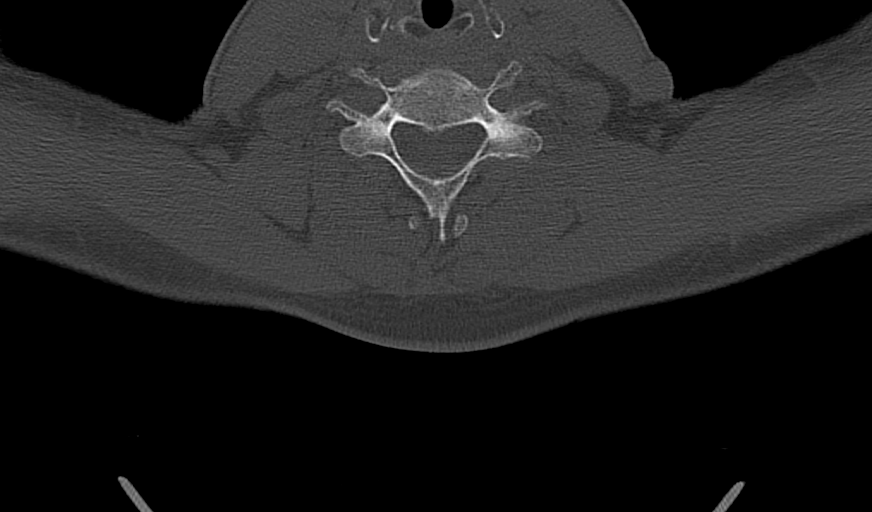
[im 69/115  brain]
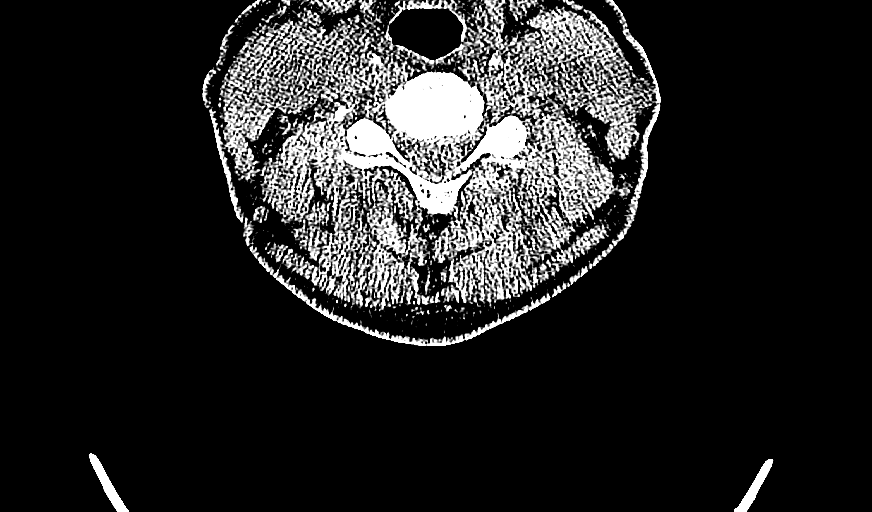
[im 69/115  bone]
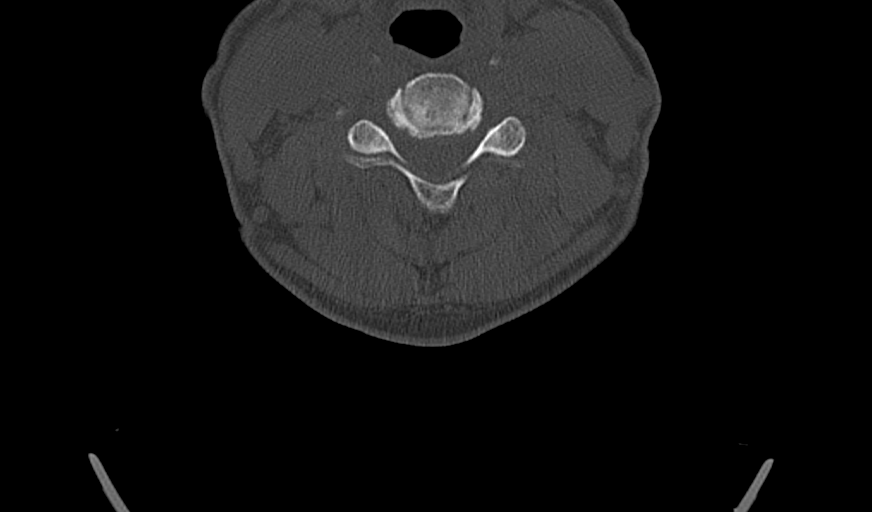
[im 92/115  bone]
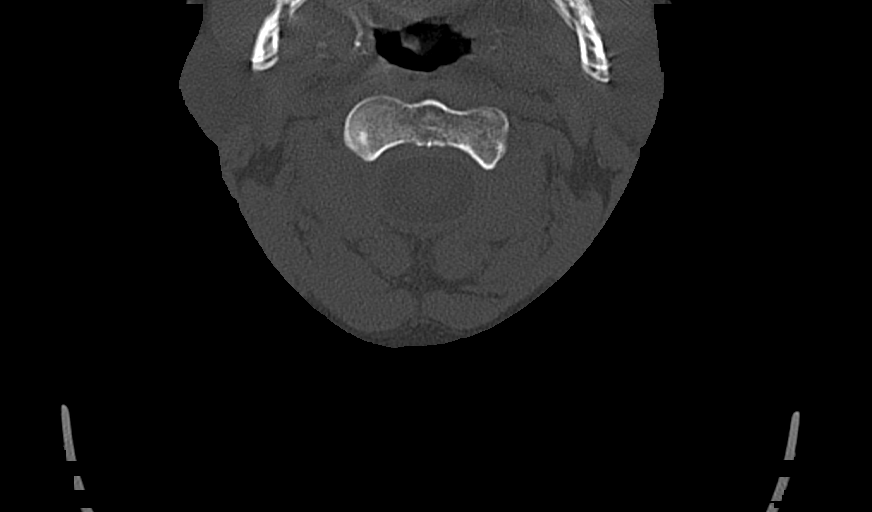
[im 103/115  bone]
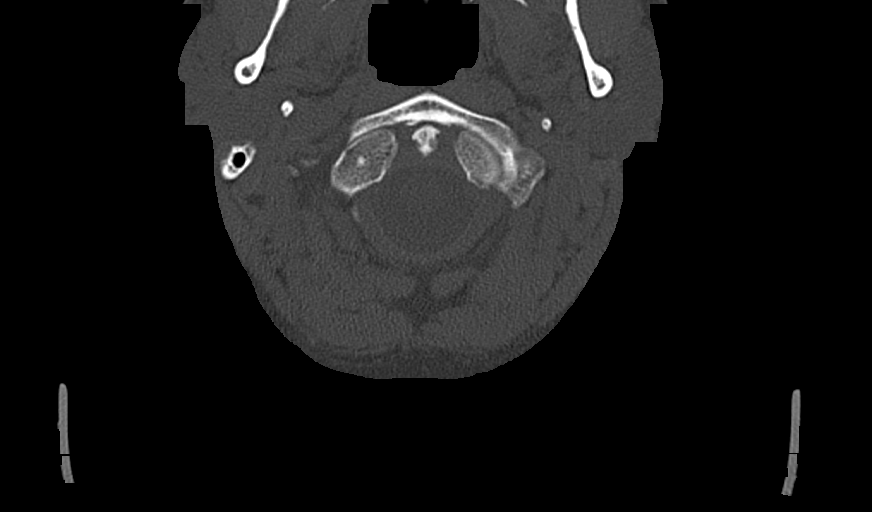

[Series 18: coronal bone · coronal · 0.36mm/px · 1 of 80 slices shown]
[im 40/80  bone]
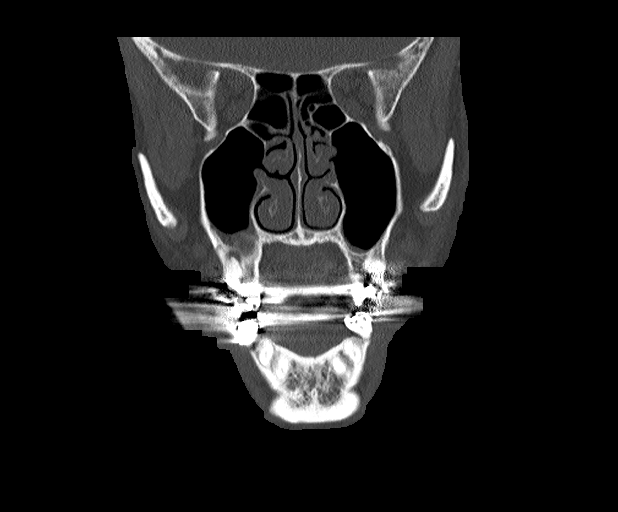

[9 of 47 positions shown; findings below may reference images not displayed]

FINDINGS: CT HEAD FINDINGS

Brain: No evidence of acute infarction, hemorrhage, hydrocephalus,
extra-axial collection or mass lesion/mass effect.

Vascular: No hyperdense vessel or unexpected calcification.

Skull: Normal. Negative for fracture or focal lesion.

Other: None.

CT MAXILLOFACIAL FINDINGS

Osseous: No fracture or mandibular dislocation. No destructive
process.

Orbits: The orbits and their contents are within normal limits. Some
periorbital swelling is noted on the left consistent with the recent
injury. Small amount of air is noted within the soft tissues
anterior to the left orbit consistent with the recent surgical
history.

Sinuses: Paranasal sinuses are within normal limits.

Soft tissues: Soft tissue swelling is noted about the left orbit as
described above. No other focal abnormality is seen.

CT CERVICAL SPINE FINDINGS

Alignment: Within normal limits.

Skull base and vertebrae: 7 cervical segments are well visualized.
Vertebral body height is well maintained. Osteophytic changes are
noted from C3-C7. No significant anterolisthesis is noted. Mild
facet hypertrophic changes are noted. No acute fracture or acute
facet abnormality is noted. Posterior fusion defect at C1 is noted.

Soft tissues and spinal canal: No acute soft tissue abnormality is
noted

Upper chest: Negative. Visualized upper chest is within normal
limits.

Other: None
IMPRESSION: CT of the head: No acute intracranial abnormality is noted.

CT of the maxillofacial bones: Soft tissue swelling about the left
orbit is noted consistent with the recent injury and prior surgery.

No acute bony abnormality is noted.

CT of cervical spine: Multilevel degenerative changes without acute
abnormality.

## 2020-08-26 ENCOUNTER — Emergency Department

## 2020-08-26 ENCOUNTER — Other Ambulatory Visit: Payer: Self-pay

## 2020-08-26 ENCOUNTER — Encounter: Payer: Self-pay | Admitting: Emergency Medicine

## 2020-08-26 ENCOUNTER — Emergency Department
Admission: EM | Admit: 2020-08-26 | Discharge: 2020-08-26 | Disposition: A | Discharge status: Home / Self Care | Attending: Emergency Medicine | Admitting: Emergency Medicine

## 2020-08-26 DIAGNOSIS — Z20822 Contact with and (suspected) exposure to covid-19: Secondary | ICD-10-CM | POA: Diagnosis not present

## 2020-08-26 DIAGNOSIS — R1084 Generalized abdominal pain: Secondary | ICD-10-CM

## 2020-08-26 DIAGNOSIS — F1210 Cannabis abuse, uncomplicated: Secondary | ICD-10-CM | POA: Diagnosis not present

## 2020-08-26 DIAGNOSIS — R1010 Upper abdominal pain, unspecified: Secondary | ICD-10-CM | POA: Diagnosis present

## 2020-08-26 DIAGNOSIS — R112 Nausea with vomiting, unspecified: Secondary | ICD-10-CM | POA: Insufficient documentation

## 2020-08-26 LAB — COMPREHENSIVE METABOLIC PANEL
ALT: 21 U/L (ref 0–44)
AST: 30 U/L (ref 15–41)
Albumin: 4.4 g/dL (ref 3.5–5.0)
Alkaline Phosphatase: 71 U/L (ref 38–126)
Anion gap: 11 (ref 5–15)
BUN: 11 mg/dL (ref 6–20)
CO2: 25 mmol/L (ref 22–32)
Calcium: 9.3 mg/dL (ref 8.9–10.3)
Chloride: 105 mmol/L (ref 98–111)
Creatinine, Ser: 0.87 mg/dL (ref 0.61–1.24)
GFR, Estimated: >60 mL/min (ref >60)
Glucose, Bld: 126 mg/dL — ABNORMAL HIGH (ref 70–99)
Potassium: 3.6 mmol/L (ref 3.5–5.1)
Sodium: 141 mmol/L (ref 135–145)
Total Bilirubin: 0.5 mg/dL (ref 0.3–1.2)
Total Protein: 7.6 g/dL (ref 6.5–8.1)

## 2020-08-26 LAB — URINALYSIS, COMPLETE (UACMP) WITH MICROSCOPIC
Bacteria, UA: NONE SEEN
Bilirubin Urine: NEGATIVE
Glucose, UA: NEGATIVE mg/dL
Hgb urine dipstick: NEGATIVE
Ketones, ur: NEGATIVE mg/dL
Leukocytes,Ua: NEGATIVE
Nitrite: NEGATIVE
Protein, ur: NEGATIVE mg/dL
Specific Gravity, Urine: 1.011 (ref 1.005–1.030)
Squamous Epithelial / LPF: NONE SEEN (ref 0–5)
pH: 9 — ABNORMAL HIGH (ref 5.0–8.0)

## 2020-08-26 LAB — CBC WITH DIFFERENTIAL/PLATELET
Abs Immature Granulocytes: 0.05 10*3/uL (ref 0.00–0.07)
Basophils Absolute: 0.1 10*3/uL (ref 0.0–0.1)
Basophils Relative: 0 %
Eosinophils Absolute: 0 10*3/uL (ref 0.0–0.5)
Eosinophils Relative: 0 %
HCT: 44.3 % (ref 39.0–52.0)
Hemoglobin: 15 g/dL (ref 13.0–17.0)
Immature Granulocytes: 0 %
Lymphocytes Relative: 10 %
Lymphs Abs: 1.3 10*3/uL (ref 0.7–4.0)
MCH: 30.1 pg (ref 26.0–34.0)
MCHC: 33.9 g/dL (ref 30.0–36.0)
MCV: 88.8 fL (ref 80.0–100.0)
Monocytes Absolute: 0.4 10*3/uL (ref 0.1–1.0)
Monocytes Relative: 3 %
Neutro Abs: 11.2 10*3/uL — ABNORMAL HIGH (ref 1.7–7.7)
Neutrophils Relative %: 87 %
Platelets: 231 10*3/uL (ref 150–400)
RBC: 4.99 MIL/uL (ref 4.22–5.81)
RDW: 15 % (ref 11.5–15.5)
WBC: 13 10*3/uL — ABNORMAL HIGH (ref 4.0–10.5)
nRBC: 0 % (ref 0.0–0.2)

## 2020-08-26 LAB — LIPASE, BLOOD: Lipase: 27 U/L (ref 11–51)

## 2020-08-26 LAB — TROPONIN I (HIGH SENSITIVITY)
Troponin I (High Sensitivity): 5 ng/L (ref <18)
Troponin I (High Sensitivity): 8 ng/L (ref <18)

## 2020-08-26 MED ORDER — ONDANSETRON HCL 4 MG/2ML IJ SOLN
INTRAMUSCULAR | Status: AC
  Administered 2020-08-26: 4 mg
  Filled 2020-08-26: qty 2

## 2020-08-26 MED ORDER — IOHEXOL 300 MG/ML  SOLN
125.0000 mL | Freq: Once | INTRAMUSCULAR | Status: AC | PRN
  Administered 2020-08-26: 125 mL via INTRAVENOUS

## 2020-08-26 MED ORDER — ONDANSETRON HCL 4 MG/2ML IJ SOLN: 4.0000 mg | Freq: Once | INTRAMUSCULAR | Status: AC

## 2020-08-26 MED ORDER — MORPHINE SULFATE (PF) 4 MG/ML IV SOLN
INTRAVENOUS | Status: AC
  Administered 2020-08-26: 4 mg via INTRAVENOUS
  Filled 2020-08-26: qty 1

## 2020-08-26 MED ORDER — ONDANSETRON HCL 4 MG/2ML IJ SOLN
INTRAMUSCULAR | Status: AC
  Administered 2020-08-26: 4 mg via INTRAVENOUS
  Filled 2020-08-26: qty 2

## 2020-08-26 MED ORDER — SODIUM CHLORIDE 0.9 % IV BOLUS
1000.0000 mL | Freq: Once | INTRAVENOUS | Status: AC
  Administered 2020-08-26: 1000 mL via INTRAVENOUS

## 2020-08-26 MED ORDER — MORPHINE SULFATE (PF) 4 MG/ML IV SOLN: 4.0000 mg | Freq: Once | INTRAVENOUS | Status: AC

## 2020-08-26 MED ORDER — ONDANSETRON 4 MG PO TBDP: 4.0000 mg | ORAL_TABLET | Freq: Three times a day (TID) | ORAL | 0 refills | Status: AC | PRN

## 2020-08-26 MED ORDER — MORPHINE SULFATE (PF) 4 MG/ML IV SOLN
4.0000 mg | Freq: Once | INTRAVENOUS | Status: AC
  Administered 2020-08-26: 4 mg via INTRAVENOUS
  Filled 2020-08-26: qty 1

## 2020-08-26 MED ORDER — MORPHINE SULFATE (PF) 4 MG/ML IV SOLN
INTRAVENOUS | Status: AC
  Administered 2020-08-26: 4 mg
  Filled 2020-08-26: qty 1

## 2020-08-26 NOTE — ED Provider Notes (Signed)
[facility] Emergency Department Provider Note   ____________________________________________   Event Date/Time   First MD Initiated Contact with Patient 08/26/20 [phone]     (approximate)  I have reviewed the triage vital signs and the nursing notes.   HISTORY  Chief Complaint Abdominal Pain   HPI Benjamin Stout is a 60 y.o. male who reports onset of colicky upper abdominal pain at 4 in the morning after he got up for work.  He has had a lot of nausea with it but no diarrhea.  He has had vomiting.  He reports the pain is very similar to what he had when he had his gallbladder problems but has had his gallbladder out.  The pain is severe nothing seems to make it better and palpation makes it worse.  Pain has been going on since it started at 4:00.  It is now 8:00.        Past Medical History:  Diagnosis Date   Anxiety    Dyslipidemia    Elevated LFTs    Head ache    Hemochromatosis     There are no problems to display for this patient.   Past Surgical History:  Procedure Laterality Date   CHOLECYSTECTOMY     HERNIA REPAIR     NASAL SEPTUM SURGERY     NO PAST SURGERIES      Prior to Admission medications   Medication Sig Start Date End Date Taking? Authorizing Provider  ondansetron (ZOFRAN ODT) 4 MG disintegrating tablet Take 1 tablet (4 mg total) by mouth every 8 (eight) hours as needed for nausea or vomiting. 08/26/20  Yes Arnaldo Natal, MD  amoxicillin-clavulanate (AUGMENTIN) 875-125 MG tablet Take 1 tablet by mouth every 12 (twelve) hours. 08/22/15   Renford Dills, NP  escitalopram (LEXAPRO) 10 MG tablet Take 10 mg by mouth daily.    [provider]  Multiple Vitamin (MULTIVITAMIN) capsule Take 1 capsule by mouth daily.    [provider]  mupirocin ointment (BACTROBAN) 2 % Apply three times a day for 5 days. 08/22/15   Renford Dills, NP    Allergies Patient has no known allergies.  History reviewed. No pertinent family  history.  Social History Social History   Tobacco Use   Smoking status: Never   Smokeless tobacco: Never  Substance Use Topics   Alcohol use: Yes    Alcohol/week: 0.0 standard drinks    Comment: 1 case of beer weekly   Drug use: No    Review of Systems  Constitutional: No fever/chills Eyes: No visual changes. ENT: No sore throat. Cardiovascular: Denies chest pain. Respiratory: Denies shortness of breath. Gastrointestinal: abdominal pain.  nausea, vomiting.  No diarrhea.  No constipation. Genitourinary: Negative for dysuria. Musculoskeletal: Negative for back pain. Skin: Negative for rash. Neurological: Negative for headaches, focal weakness   ____________________________________________   PHYSICAL EXAM:  VITAL SIGNS: ED Triage Vitals  Enc Vitals Group     BP 08/26/20 0739 (!) 192/110     Pulse Rate 08/26/20 0739 (!) 58     Resp 08/26/20 0739 20     Temp 08/26/20 0739 97.7 F (36.5 C)     Temp Source 08/26/20 0739 Oral     SpO2 08/26/20 0739 100 %     Weight 08/26/20 0737 210 lb 1.6 oz (95.3 kg)     Height 08/26/20 0737 6\' 1"  (1.854 m)     Head Circumference --      Peak Flow --  Pain Score 08/26/20 0737 6     Pain Loc --      Pain Edu? --      Excl. in [place]? --     Constitutional: Alert and oriented.  Patient laying on bed writhing in pain. Eyes: Conjunctivae are normal.  Head: Atraumatic. Nose: No congestion/rhinnorhea. Mouth/Throat: Mucous membranes are moist.  Oropharynx non-erythematous. Neck: No stridor.   Cardiovascular: Normal rate, regular rhythm. Grossly normal heart sounds.  Good peripheral circulation. Respiratory: Normal respiratory effort.  No retractions. Lungs CTAB. Gastrointestinal: Soft tender diffusely especially in the mid abdomen.   there is tenderness to palpation but no worsening with percussion no distention. No abdominal bruits. No CVA tenderness. Musculoskeletal: No lower extremity tenderness nor edema.   Neurologic:  Normal  speech and language. No gross focal neurologic deficits are appreciated.  Skin:  Skin is warm, dry and intact. No rash noted.   ____________________________________________   LABS (all labs ordered are listed, but only abnormal results are displayed)  Labs Reviewed  COMPREHENSIVE METABOLIC PANEL - Abnormal; Notable for the following components:      Result Value   Glucose, Bld 126 (*)    All other components within normal limits  URINALYSIS, COMPLETE (UACMP) WITH MICROSCOPIC - Abnormal; Notable for the following components:   Color, Urine STRAW (*)    APPearance CLEAR (*)    pH 9.0 (*)    All other components within normal limits  CBC WITH DIFFERENTIAL/PLATELET - Abnormal; Notable for the following components:   WBC 13.0 (*)    Neutro Abs 11.2 (*)    All other components within normal limits  SARS CORONAVIRUS 2 (TAT 6-24 HRS)  LIPASE, BLOOD  TROPONIN I (HIGH SENSITIVITY)  TROPONIN I (HIGH SENSITIVITY)   ____________________________________________  EKG  EKG read interpreted by me shows sinus bradycardia rate of 56 normal axis no obvious acute ST-T wave changes are seen ____________________________________________  RADIOLOGY Jill Poling, personally viewed and evaluated these images (plain radiographs) as part of my medical decision making, as well as reviewing the written report by the radiologist.  ED MD interpretation:    Official radiology report(s): CT ABDOMEN PELVIS W CONTRAST  Result Date: 08/26/2020 CLINICAL DATA:  84-year-old male with acute abdominal pain. EXAM: CT ABDOMEN AND PELVIS WITHOUT CONTRAST TECHNIQUE: Multidetector CT imaging of the abdomen and pelvis was performed following the standard protocol without IV contrast. COMPARISON:  None. FINDINGS: Lower chest: Right basilar subsegmental atelectasis. Mild global cardiomegaly. No pericardial effusion. Hepatobiliary: The liver is normal in size, contour, and attenuation. There are innumerable diffuse  well-defined hypoattenuating lesions, the largest in the subcapsular right lobe measuring up to 1.7 cm, compatible with simple the gallbladder surgically absent. No intra or extrahepatic biliary ductal dilation. Hepatic cysts. Pancreas: Unremarkable. No pancreatic ductal dilatation or surrounding inflammatory changes. Spleen: Normal in size without focal abnormality. Adrenals/Urinary Tract: About the medial limb of the right adrenal gland is a 2.4 cm hypoenhancing mass with average Hounsfield units of 30. The left adrenal gland is normal in size morphology. The kidneys are symmetric in size and enhancement bilaterally. No focal masses. There is a 6 mm left interpolar renal calculus. No hydronephrosis bilaterally. Bladder is within normal limits. Stomach/Bowel: Stomach is within normal limits. Appendix appears normal. No evidence of bowel wall thickening, distention, or inflammatory changes. Vascular/Lymphatic: Aortic atherosclerosis. No enlarged abdominal or pelvic lymph nodes. Reproductive: Prostate is unremarkable. Other: No abdominal wall hernia or abnormality. No abdominopelvic ascites. Musculoskeletal: Medial left sacral ala  bone island. Mild sigmoid curvature of the visualized thoracolumbar spine. Mild multilevel discogenic degenerative changes. No acute osseous abnormality or aggressive appearing osseous lesion. IMPRESSION: 1. No acute abdominopelvic abnormality. 2. Right basilar subsegmental atelectasis. 3. Nonobstructive 6 mm left renal calculus. 4. Incidentally noted 2.4 cm hypoenhancing right adrenal nodule which is indeterminate on this single phase study. Recommend nonemergent adrenal mass protocol CT for further characterization. 5. Diffuse, mostly subcentimeter simple hepatic cysts. Electronically Signed   By: Marliss Coots MD   On: 08/26/2020 09:30    ____________________________________________   PROCEDURES  Procedure(s) performed (including Critical  Care):  Procedures   ____________________________________________   INITIAL IMPRESSION / ASSESSMENT AND PLAN / ED COURSE  As part of my medical decision making, I reviewed the following data within the electronic MEDICAL RECORD NUMBERRecords available through the chart review in the hospital and through care everywhere were reviewed.  Surgery was consulted.  Surgery felt the patient was not a surgical candidate and patient then had his pain resolve and was not able to tolerate p.o.  We will discharge him.  He will return if he has any further problems we will give him some Zofran if need be.              ____________________________________________   FINAL CLINICAL IMPRESSION(S) / ED DIAGNOSES  Final diagnoses:  Generalized abdominal pain     ED Discharge Orders          Ordered    ondansetron (ZOFRAN ODT) 4 MG disintegrating tablet  Every 8 hours PRN        08/26/20 1416             Note:  This document was prepared using Dragon voice recognition software and may include unintentional dictation errors.    Arnaldo Natal, MD 08/26/20 1420

## 2020-08-26 NOTE — Discharge Instructions (Signed)
Use the Zofran melt on your tongue wafers 1 wafer 3 times a day as needed for nausea.  Please return for fever worsening pain or nausea or vomiting that is not controlled with Zofran.  Please also return if you just generally feel worse.

## 2020-08-26 NOTE — ED Triage Notes (Signed)
Pt comes into the ED via POV c/o epigastric pain that started this morning.  Pt admits to nausea with the pain but denies any diarrhea or constipation.  Pt states he has already had his Gallbladder removed.  Pt presents in pain and is uncomfortable during triage,.

## 2020-08-26 NOTE — ED Notes (Signed)
Pt in bed, moaning in pain, states epigastric pain that started 0400 with vomiting, did take one zofran from home with no relief. Pt does not have gallbladder. Wife at bedside, bed locked and low, call light in reach.

## 2020-08-26 NOTE — Consult Note (Signed)
[facility] SURGICAL CONSULTATION NOTE (initial) - cpt: 992431/2/3/4/5 (Outpatient/ED)   HISTORY OF PRESENT ILLNESS (HPI):  60 y.o. male presented to [facility] ED today for evaluation of abdominal pain. Patient reports around a 8 hour history of colicky, cramping abdominal discomfort. This was described as relatively generalized and waxed and waned. Nothing seemed to make this better. He did endorse diarrhea in the last few days leading up to the pain but this stopped. He also endorses associated nausea and emesis. No fever, chills, CP, SOB, or urinary changes. No history of similar in the past. Dinner last night was tacos, and he does not think there were any issues with the preparation. Only previous intra-abdominal surgery is positive for cholecystectomy. Work up in the ED revealed a mild leukocytosis to 13.0K. CT Abdomen/Pelvis was without any obvious intra-abdominal process.   At the time of my evaluation, he reports that his abdominal pain and nausea have essentially resolved. He does also endorse that he uses marijuana daily but is not willing to quantify this.   Surgery is consulted by emergency physician Dr. Dorothea Glassman, MD in this context for evaluation and management of intractable nausea/emesis and colicky abdominal pain.  PAST MEDICAL HISTORY (PMH):  Past Medical History:  Diagnosis Date   Anxiety    Dyslipidemia    Elevated LFTs    Head ache    Hemochromatosis      PAST SURGICAL HISTORY (PSH):  Past Surgical History:  Procedure Laterality Date   CHOLECYSTECTOMY     HERNIA REPAIR     NASAL SEPTUM SURGERY     NO PAST SURGERIES       MEDICATIONS:  Prior to Admission medications   Medication Sig Start Date End Date Taking? Authorizing Provider  amoxicillin-clavulanate (AUGMENTIN) 875-125 MG tablet Take 1 tablet by mouth every 12 (twelve) hours. 08/22/15   Renford Dills, NP  escitalopram (LEXAPRO) 10 MG tablet Take 10 mg by mouth daily.    [provider]  Multiple Vitamin (MULTIVITAMIN) capsule Take 1 capsule by mouth daily.    [provider]  mupirocin ointment (BACTROBAN) 2 % Apply three times a day for 5 days. 08/22/15   Renford Dills, NP     ALLERGIES:  No Known Allergies   SOCIAL HISTORY:  Social History   Socioeconomic History   Marital status: Married    Spouse name: Not on file   Number of children: Not on file   Years of education: Not on file   Highest education level: Not on file  Occupational History   Not on file  Tobacco Use   Smoking status: Never   Smokeless tobacco: Never  Substance and Sexual Activity   Alcohol use: Yes    Alcohol/week: 0.0 standard drinks    Comment: 1 case of beer weekly   Drug use: No   Sexual activity: Not on file  Other Topics Concern   Not on file  Social History Narrative   Not on file   Social Determinants of Health   Financial Resource Strain: Not on file  Food Insecurity: Not on file  Transportation Needs: Not on file  Physical Activity: Not on file  Stress: Not on file  Social Connections: Not on file  Intimate Partner Violence: Not on file     FAMILY HISTORY:  History reviewed. No pertinent family history.    REVIEW OF SYSTEMS:  Review of Systems  Constitutional:  Negative for chills and fever.  HENT:  Negative for congestion and sore  throat.   Respiratory:  Negative for cough and shortness of breath.   Cardiovascular:  Negative for chest pain and palpitations.  Gastrointestinal:  Positive for abdominal pain, diarrhea, nausea and vomiting. Negative for blood in stool and constipation.  Genitourinary:  Negative for dysuria and urgency.  All other systems reviewed and are negative.  VITAL SIGNS:  Temp:  [97.7 F (36.5 C)] 97.7 F (36.5 C) (07/06 0739) Pulse Rate:  [55-64] 58 (07/06 1215) Resp:  [13-27] 27 (07/06 1215) BP: (152-199)/(82-113) 156/93 (07/06 1200) SpO2:  [92 %-100 %] 95 % (07/06 1215) Weight:  [95.3 kg] 95.3 kg (07/06 0737)      Height: 6\' 1"  (185.4 cm) Weight: 95.3 kg BMI (Calculated): 27.73   INTAKE/OUTPUT:  No intake/output data recorded.  PHYSICAL EXAM:  Physical Exam Vitals and nursing note reviewed.  Constitutional:      General: He is not in acute distress.    Appearance: He is well-developed. He is not ill-appearing.  HENT:     Head: Normocephalic and atraumatic.  Eyes:     General: No scleral icterus.    Extraocular Movements: Extraocular movements intact.  Cardiovascular:     Rate and Rhythm: Normal rate.     Heart sounds: Normal heart sounds. No murmur heard. Pulmonary:     Effort: Pulmonary effort is normal. No respiratory distress.     Breath sounds: Normal breath sounds.  Abdominal:     General: Abdomen is flat. There is no distension.     Palpations: Abdomen is soft.     Tenderness: There is no abdominal tenderness. There is no guarding or rebound.  Genitourinary:    Comments: Deferred Skin:    General: Skin is warm and dry.     Coloration: Skin is not jaundiced or pale.  Neurological:     General: No focal deficit present.     Mental Status: He is alert and oriented to person, place, and time.  Psychiatric:        Mood and Affect: Mood normal.        Behavior: Behavior normal.     Labs:  CBC Latest Ref Rng & Units 08/26/2020  WBC 4.0 - 10.5 K/uL 13.0(H)  Hemoglobin 13.0 - 17.0 g/dL 10/27/2020  Hematocrit 98.3 - 52.0 % 44.3  Platelets 150 - 400 K/uL 231   CMP Latest Ref Rng & Units 08/26/2020  Glucose 70 - 99 mg/dL 10/27/2020)  BUN 6 - 20 mg/dL 11  Creatinine 505(L - 9.76 mg/dL 7.34  Sodium 1.93 - 790 mmol/L 141  Potassium 3.5 - 5.1 mmol/L 3.6  Chloride 98 - 111 mmol/L 105  CO2 22 - 32 mmol/L 25  Calcium 8.9 - 10.3 mg/dL 9.3  Total Protein 6.5 - 8.1 g/dL 7.6  Total Bilirubin 0.3 - 1.2 mg/dL 0.5  Alkaline Phos 38 - 126 U/L 71  AST 15 - 41 U/L 30  ALT 0 - 44 U/L 21     Imaging studies:   CT Abdomen/Pelvis (08/26/2020) personally reviewed which shows few, scattered, non-specific,  mild small bowel dilation, but otherwise without intra-abdominal etiology to explain symptoms, and radiologist report reviewed below:  IMPRESSION: 1. No acute abdominopelvic abnormality. 2. Right basilar subsegmental atelectasis. 3. Nonobstructive 6 mm left renal calculus. 4. Incidentally noted 2.4 cm hypoenhancing right adrenal nodule which is indeterminate on this single phase study. Recommend nonemergent adrenal mass protocol CT for further characterization. 5. Diffuse, mostly subcentimeter simple hepatic cysts.   Assessment/Plan: (ICD-10's: R10.84) 60 y.o. male with, now resolved,  colicky abdominal pain, nausea, emesis of unclear etiology. I do believe this most likely represents gastroenteritis given his symptomology. Another possibility could be hyperemesis from cannabis abuse. He does not have any identifiable surgical issues.    - No surgical intervention warranted at this time. I do feel it would be reasonable to give him a PO trial in the ED. If he passes, he can be discharged home +/- with antiemetics and encouraged PO hydration. If he fails, then can consider admission to hospitalist for intractable nausea/emesis. Nothing further to add from surgical perspective. We will be available as needed.    All of the above findings and recommendations were discussed with the patient, and all of patient's questions were answered to his expressed satisfaction.  Thank you for the opportunity to participate in this patient's care.   -- Lynden Oxford, PA-C [facility] 08/26/2020, 1:17 PM [phone] M-F: 7am - 4pm

## 2020-08-26 NOTE — ED Notes (Signed)
Pain med.given per MD order. Pt no longer moaning, is able to rest. Wife remains at bedside.

## 2020-08-27 LAB — SARS CORONAVIRUS 2 (TAT 6-24 HRS): SARS Coronavirus 2: NEGATIVE

## 2022-03-08 ENCOUNTER — Emergency Department
Admission: EM | Admit: 2022-03-08 | Discharge: 2022-03-08 | Disposition: A | Discharge status: Home / Self Care | Attending: Student in an Organized Health Care Education/Training Program | Admitting: Student in an Organized Health Care Education/Training Program

## 2022-03-08 ENCOUNTER — Other Ambulatory Visit: Payer: Self-pay

## 2022-03-08 ENCOUNTER — Encounter: Payer: Self-pay | Admitting: Emergency Medicine

## 2022-03-08 ENCOUNTER — Emergency Department

## 2022-03-08 DIAGNOSIS — R6883 Chills (without fever): Secondary | ICD-10-CM | POA: Insufficient documentation

## 2022-03-08 DIAGNOSIS — R109 Unspecified abdominal pain: Secondary | ICD-10-CM | POA: Diagnosis present

## 2022-03-08 DIAGNOSIS — Z20822 Contact with and (suspected) exposure to covid-19: Secondary | ICD-10-CM | POA: Diagnosis not present

## 2022-03-08 DIAGNOSIS — R1084 Generalized abdominal pain: Secondary | ICD-10-CM | POA: Insufficient documentation

## 2022-03-08 DIAGNOSIS — R112 Nausea with vomiting, unspecified: Secondary | ICD-10-CM | POA: Insufficient documentation

## 2022-03-08 LAB — URINALYSIS, ROUTINE W REFLEX MICROSCOPIC
Bacteria, UA: NONE SEEN
Bilirubin Urine: NEGATIVE
Glucose, UA: NEGATIVE mg/dL
Hgb urine dipstick: NEGATIVE
Ketones, ur: 20 mg/dL — AB
Leukocytes,Ua: NEGATIVE
Nitrite: NEGATIVE
Protein, ur: 30 mg/dL — AB
Specific Gravity, Urine: 1.014 (ref 1.005–1.030)
Squamous Epithelial / HPF: NONE SEEN /HPF (ref 0–5)
pH: 9 — ABNORMAL HIGH (ref 5.0–8.0)

## 2022-03-08 LAB — RESP PANEL BY RT-PCR (RSV, FLU A&B, COVID)  RVPGX2
Influenza A by PCR: NEGATIVE
Influenza B by PCR: NEGATIVE
Resp Syncytial Virus by PCR: NEGATIVE
SARS Coronavirus 2 by RT PCR: NEGATIVE

## 2022-03-08 LAB — COMPREHENSIVE METABOLIC PANEL
ALT: 21 U/L (ref 0–44)
AST: 36 U/L (ref 15–41)
Albumin: 4.7 g/dL (ref 3.5–5.0)
Alkaline Phosphatase: 64 U/L (ref 38–126)
Anion gap: 15 (ref 5–15)
BUN: 10 mg/dL (ref 8–23)
CO2: 17 mmol/L — ABNORMAL LOW (ref 22–32)
Calcium: 9.5 mg/dL (ref 8.9–10.3)
Chloride: 103 mmol/L (ref 98–111)
Creatinine, Ser: 0.92 mg/dL (ref 0.61–1.24)
GFR, Estimated: >60 mL/min (ref >60)
Glucose, Bld: 128 mg/dL — ABNORMAL HIGH (ref 70–99)
Potassium: 3.3 mmol/L — ABNORMAL LOW (ref 3.5–5.1)
Sodium: 135 mmol/L (ref 135–145)
Total Bilirubin: 1.2 mg/dL (ref 0.3–1.2)
Total Protein: 7.9 g/dL (ref 6.5–8.1)

## 2022-03-08 LAB — CBC
HCT: 45.2 % (ref 39.0–52.0)
Hemoglobin: 15.5 g/dL (ref 13.0–17.0)
MCH: 30.5 pg (ref 26.0–34.0)
MCHC: 34.3 g/dL (ref 30.0–36.0)
MCV: 89 fL (ref 80.0–100.0)
Platelets: 298 10*3/uL (ref 150–400)
RBC: 5.08 MIL/uL (ref 4.22–5.81)
RDW: 14.6 % (ref 11.5–15.5)
WBC: 11.8 10*3/uL — ABNORMAL HIGH (ref 4.0–10.5)
nRBC: 0 % (ref 0.0–0.2)

## 2022-03-08 LAB — LACTIC ACID, PLASMA
Lactic Acid, Venous: 3.1 mmol/L (ref 0.5–1.9)
Lactic Acid, Venous: 2.1 mmol/L (ref 0.5–1.9)

## 2022-03-08 LAB — TROPONIN I (HIGH SENSITIVITY): Troponin I (High Sensitivity): 5 ng/L (ref <18)

## 2022-03-08 LAB — LIPASE, BLOOD: Lipase: 33 U/L (ref 11–51)

## 2022-03-08 MED ORDER — ONDANSETRON 4 MG PO TBDP: 4.0000 mg | ORAL_TABLET | Freq: Three times a day (TID) | ORAL | 0 refills | Status: AC | PRN

## 2022-03-08 MED ORDER — ONDANSETRON 4 MG PO TBDP: 4.0000 mg | ORAL_TABLET | Freq: Three times a day (TID) | ORAL | 0 refills | Status: DC | PRN

## 2022-03-08 MED ORDER — MORPHINE SULFATE (PF) 4 MG/ML IV SOLN
4.0000 mg | INTRAVENOUS | Status: DC | PRN
  Administered 2022-03-08 (×2): 4 mg via INTRAVENOUS
  Filled 2022-03-08 (×2): qty 1

## 2022-03-08 MED ORDER — SODIUM CHLORIDE 0.9 % IV BOLUS
1000.0000 mL | Freq: Once | INTRAVENOUS | Status: AC
  Administered 2022-03-08: 1000 mL via INTRAVENOUS

## 2022-03-08 MED ORDER — IOHEXOL 300 MG/ML  SOLN
100.0000 mL | Freq: Once | INTRAMUSCULAR | Status: AC | PRN
  Administered 2022-03-08: 100 mL via INTRAVENOUS

## 2022-03-08 MED ORDER — ONDANSETRON 4 MG PO TBDP
4.0000 mg | ORAL_TABLET | Freq: Once | ORAL | Status: AC
  Administered 2022-03-08: 4 mg via ORAL
  Filled 2022-03-08: qty 1

## 2022-03-08 MED ORDER — OXYCODONE-ACETAMINOPHEN 5-325 MG PO TABS
1.0000 | ORAL_TABLET | Freq: Once | ORAL | Status: AC
  Administered 2022-03-08: 1 via ORAL
  Filled 2022-03-08: qty 1

## 2022-03-08 NOTE — ED Provider Notes (Signed)
[facility] Provider Note    Event Date/Time   First MD Initiated Contact with Patient 03/08/22 1548     (approximate)   History   Abdominal Pain   HPI  Benjamin Stout is a 62 y.o. male status post cholecystectomy several decades ago presents to the ER for acute right-sided abdominal pain vomiting.  Is never had pain like this before.  Became diaphoretic from the abdominal pain.  No dysuria.  Denies any pain radiating through to his back.  Had some chills.  Has had some nausea.  Has had some irregular bowel movements and loose stool.  No recent antibiotics.  Denies any chest pain no shortness of breath.     Physical Exam   Triage Vital Signs: ED Triage Vitals  Enc Vitals Group     BP 03/08/22 1419 (!) 163/136     Pulse Rate 03/08/22 1419 67     Resp 03/08/22 1419 18     Temp 03/08/22 1419 98.4 F (36.9 C)     Temp Source 03/08/22 1419 Oral     SpO2 03/08/22 1419 100 %     Weight 03/08/22 1420 210 lb (95.3 kg)     Height --      Head Circumference --      Peak Flow --      Pain Score 03/08/22 1420 10     Pain Loc --      Pain Edu? --      Excl. in [place]? --     Most recent vital signs: Vitals:   03/08/22 1758 03/08/22 1847  BP: (!) 150/110 (!) 148/100  Pulse: 68 70  Resp: 18 18  Temp:  98 F (36.7 C)  SpO2: 100% 99%     Constitutional: Alert  Eyes: Conjunctivae are normal.  Head: Atraumatic. Nose: No congestion/rhinnorhea. Mouth/Throat: Mucous membranes are moist.   Neck: Painless ROM.  Cardiovascular:   Good peripheral circulation. Respiratory: Normal respiratory effort.  No retractions.  Gastrointestinal: Soft with mild right-sided tenderness to palpation no guarding or rebound. Musculoskeletal:  no deformity Neurologic:  MAE spontaneously. No gross focal neurologic deficits are appreciated.  Skin:  Skin is warm, dry and intact. No rash noted. Psychiatric: Mood and affect are normal. Speech and behavior are normal.    ED  Results / Procedures / Treatments   Labs (all labs ordered are listed, but only abnormal results are displayed) Labs Reviewed  COMPREHENSIVE METABOLIC PANEL - Abnormal; Notable for the following components:      Result Value   Potassium 3.3 (*)    CO2 17 (*)    Glucose, Bld 128 (*)    All other components within normal limits  CBC - Abnormal; Notable for the following components:   WBC 11.8 (*)    All other components within normal limits  URINALYSIS, ROUTINE W REFLEX MICROSCOPIC - Abnormal; Notable for the following components:   Color, Urine YELLOW (*)    APPearance CLEAR (*)    pH 9.0 (*)    Ketones, ur 20 (*)    Protein, ur 30 (*)    All other components within normal limits  LACTIC ACID, PLASMA - Abnormal; Notable for the following components:   Lactic Acid, Venous 3.1 (*)    All other components within normal limits  LACTIC ACID, PLASMA - Abnormal; Notable for the following components:   Lactic Acid, Venous 2.1 (*)    All other components within normal limits  RESP PANEL BY RT-PCR (RSV,  FLU A&B, COVID)  RVPGX2  LIPASE, BLOOD  TROPONIN I (HIGH SENSITIVITY)     EKG     RADIOLOGY Please see ED Course for my review and interpretation.  I personally reviewed all radiographic images ordered to evaluate for the above acute complaints and reviewed radiology reports and findings.  These findings were personally discussed with the patient.  Please see medical record for radiology report.    PROCEDURES:  Critical Care performed: No  Procedures   MEDICATIONS ORDERED IN ED: Medications  morphine (PF) 4 MG/ML injection 4 mg (4 mg Intravenous Given 03/08/22 1831)  ondansetron (ZOFRAN-ODT) disintegrating tablet 4 mg (4 mg Oral Given 03/08/22 1433)  sodium chloride 0.9 % bolus 1,000 mL (0 mLs Intravenous Stopped 03/08/22 1831)  iohexol (OMNIPAQUE) 300 MG/ML solution 100 mL (100 mLs Intravenous Contrast Given 03/08/22 1636)  oxyCODONE-acetaminophen (PERCOCET/ROXICET) 5-325  MG per tablet 1 tablet (1 tablet Oral Given 03/08/22 1908)     IMPRESSION / MDM / ASSESSMENT AND PLAN / ED COURSE  I reviewed the triage vital signs and the nursing notes.                              Differential diagnosis includes, but is not limited to, enteritis, colitis, pancreatitis, biliary pathology, diverticulitis, appendicitis, UTI, stone, AAA, dissection, mesenteric ischemia, foodborne illness, viral illness, dehydration  Patient presenting to the ER for evaluation of symptoms as described above.  Based on symptoms, risk factors and considered above differential, this presenting complaint could reflect a potentially life-threatening illness therefore the patient will be placed on continuous pulse oximetry and telemetry for monitoring.  Laboratory evaluation will be sent to evaluate for the above complaints.  Given patient's presentation and right-sided abdominal pain postoperative will order IV fluids, IV morphine, CT imaging for the above differential.   Clinical Course as of 03/08/22 1936  Tue Mar 08, 2022  1719 CT imaging on my review and interpretation without evidence of SBO perforation.  Does have mildly elevated lactate.  CT imaging but formal radiology report with no acute intra-abdominal finding.  Have lower suspicion for mesenteric ischemia is otherwise nontoxic-appearing will continue observe and recheck lactate after IV fluids. [PR]  1742 Patient reassessed.  He is now pain-free.  Resting comfortably.  Repeat exam is reassuring.  Will observe for IV fluids and repeat lactate.  If downtrending or negative patient remains pain-free I do think it be appropriate for outpatient follow-up. [PR]  1911 CT ABDOMEN PELVIS W CONTRAST [PR]  1933 Patient reassessed.  Remains pain-free.  He is tolerating p.o.  Repeat lactate improved after IV fluids.  Troponin negative.  Given nausea vomiting diarrhea suspect viral illness.  Given reassuring CT imaging now the patient is pain-free with  stable vital signs and reassuring exam I think that he is appropriate for trial of outpatient management.  We discussed strict return precautions.  Patient agreeable with plan. [PR]    Clinical Course User Index [PR] Willy Eddy, MD    FINAL CLINICAL IMPRESSION(S) / ED DIAGNOSES   Final diagnoses:  Generalized abdominal pain  Nausea and vomiting, unspecified vomiting type     Rx / DC Orders   ED Discharge Orders          Ordered    ondansetron (ZOFRAN-ODT) 4 MG disintegrating tablet  Every 8 hours PRN        03/08/22 1933  Note:  This document was prepared using Dragon voice recognition software and may include unintentional dictation errors.    Merlyn Lot, MD 03/08/22 [phone]

## 2022-03-08 NOTE — Discharge Instructions (Signed)

## 2022-03-08 NOTE — ED Notes (Signed)
Patient verbalizes understanding of discharge instructions. Opportunity for questioning and answers were provided. Armband removed by staff, pt discharged from ED. Ambulated out to lobby with wife  

## 2022-03-08 NOTE — ED Triage Notes (Signed)
Pt sts the is having abd pain. Pt sts that the pain started around 0800 today. Pt has hx of gallstones and pt sts it feels the same.
